# Patient Record
Sex: Female | Born: 1950 | Race: White | Hispanic: No | Marital: Married | Smoking: Never smoker
Health system: Southern US, Community
[De-identification: ages and names within clinical notes are randomized; demographics above are authoritative.]

## PROBLEM LIST (undated history)

## (undated) HISTORY — PX: APPENDECTOMY: SHX54

## (undated) HISTORY — PX: CARPOMETACARPAL (CMC) FUSION OF THUMB: SHX6290

## (undated) HISTORY — PX: WRIST SURGERY: SHX841

## (undated) HISTORY — PX: JOINT REPLACEMENT: SHX530

---

## 2018-05-06 ENCOUNTER — Emergency Department (HOSPITAL_COMMUNITY)
Admission: EM | Admit: 2018-05-06 | Discharge: 2018-05-06 | Disposition: A | Discharge status: Home / Self Care | Payer: BLUE CROSS/BLUE SHIELD | Attending: Emergency Medicine | Admitting: Emergency Medicine

## 2018-05-06 ENCOUNTER — Encounter (HOSPITAL_COMMUNITY): Payer: Self-pay | Admitting: Emergency Medicine

## 2018-05-06 ENCOUNTER — Emergency Department (HOSPITAL_COMMUNITY): Payer: BLUE CROSS/BLUE SHIELD

## 2018-05-06 ENCOUNTER — Other Ambulatory Visit: Payer: Self-pay

## 2018-05-06 DIAGNOSIS — Y999 Unspecified external cause status: Secondary | ICD-10-CM | POA: Diagnosis not present

## 2018-05-06 DIAGNOSIS — Y929 Unspecified place or not applicable: Secondary | ICD-10-CM | POA: Diagnosis not present

## 2018-05-06 DIAGNOSIS — S52532A Colles' fracture of left radius, initial encounter for closed fracture: Secondary | ICD-10-CM | POA: Diagnosis not present

## 2018-05-06 DIAGNOSIS — W010XXA Fall on same level from slipping, tripping and stumbling without subsequent striking against object, initial encounter: Secondary | ICD-10-CM | POA: Diagnosis not present

## 2018-05-06 DIAGNOSIS — S6992XA Unspecified injury of left wrist, hand and finger(s), initial encounter: Secondary | ICD-10-CM | POA: Diagnosis present

## 2018-05-06 DIAGNOSIS — Z23 Encounter for immunization: Secondary | ICD-10-CM | POA: Diagnosis not present

## 2018-05-06 DIAGNOSIS — Y93K1 Activity, walking an animal: Secondary | ICD-10-CM | POA: Insufficient documentation

## 2018-05-06 MED ORDER — TETANUS-DIPHTH-ACELL PERTUSSIS 5-2.5-18.5 LF-MCG/0.5 IM SUSP
0.5000 mL | Freq: Once | INTRAMUSCULAR | Status: AC
Start: 2018-05-06 — End: 2018-05-06
  Administered 2018-05-06: 0.5 mL via INTRAMUSCULAR
  Filled 2018-05-06: qty 0.5

## 2018-05-06 MED ORDER — FENTANYL CITRATE (PF) 100 MCG/2ML IJ SOLN
50.0000 ug | Freq: Once | INTRAMUSCULAR | Status: AC
  Administered 2018-05-06: 50 ug via INTRAVENOUS
  Filled 2018-05-06: qty 2

## 2018-05-06 MED ORDER — LIDOCAINE HCL (PF) 1 % IJ SOLN: 30.0000 mL | Freq: Once | INTRAMUSCULAR | Status: DC

## 2018-05-06 MED ORDER — SODIUM CHLORIDE 0.9 % IV BOLUS
1000.0000 mL | Freq: Once | INTRAVENOUS | Status: AC
  Administered 2018-05-06: 1000 mL via INTRAVENOUS

## 2018-05-06 MED ORDER — OXYCODONE HCL 5 MG PO TABS: 5.0000 mg | ORAL_TABLET | ORAL | 0 refills | Status: DC | PRN

## 2018-05-06 MED ORDER — OXYCODONE-ACETAMINOPHEN 5-325 MG PO TABS
1.0000 | ORAL_TABLET | Freq: Once | ORAL | Status: AC
  Administered 2018-05-06: 1 via ORAL
  Filled 2018-05-06: qty 1

## 2018-05-06 NOTE — ED Provider Notes (Signed)
[facility] EMERGENCY DEPARTMENT Provider Note   CSN: [id number] Arrival date & time: 05/06/18  1920     History   Chief Complaint Chief Complaint  Patient presents with  . Fall    HPI Veronica Santana is a 67 y.o. female.  She is right-hand dominant.  She had a mechanical fall while walking the dog and landing on her outstretched left hand.  She is complaining of severe left wrist pain and some left elbow pain since the fall.  She was diaphoretic and felt like she was going to pass out when she try to get up.  Those symptoms are improved here although she still has a significant amount of pain.  She relates the symptoms of the sweats and the presyncope to the pain and she says this is happened before.  She denies any chest pain or shortness of breath.  She has been ambulatory since the fall.  No LOC no head neck or back pain.  No numbness or tingling anywhere.  The history is provided by the patient.  Fall  This is a new problem. The current episode started 1 to 2 hours ago. The problem occurs constantly. The problem has not changed since onset.Pertinent negatives include no chest pain, no abdominal pain, no headaches and no shortness of breath. The symptoms are aggravated by bending and twisting. The symptoms are relieved by rest. She has tried rest for the symptoms. The treatment provided mild relief.    History reviewed. No pertinent past medical history.  There are no active problems to display for this patient.   History reviewed. No pertinent surgical history.   OB History   None      Home Medications    Prior to Admission medications   Medication Sig Start Date End Date Taking? Authorizing Provider  oxyCODONE (ROXICODONE) 5 MG immediate release tablet Take 1 tablet (5 mg total) by mouth every 4 (four) hours as needed for severe pain. 05/06/18   Hayden Rasmussen, MD    Family History No family history on file.  Social History Social History    Tobacco Use  . Smoking status: Never Smoker  . Smokeless tobacco: Never Used  Substance Use Topics  . Alcohol use: Yes    Comment: occasionally  . Drug use: Never     Allergies   Patient has no allergy information on record.   Review of Systems Review of Systems  Constitutional: Negative for fever.  HENT: Negative for sore throat.   Eyes: Negative for visual disturbance.  Respiratory: Negative for shortness of breath.   Cardiovascular: Negative for chest pain.  Gastrointestinal: Negative for abdominal pain.  Genitourinary: Negative for dysuria.  Musculoskeletal: Negative for back pain and neck pain.  Skin: Positive for wound. Negative for rash.  Neurological: Negative for headaches.     Physical Exam Updated Vital Signs BP (!) 81/54 (BP Location: Right Arm)   Pulse 62   Temp (!) 97.5 F (36.4 C) (Oral)   Resp 18   SpO2 100%   Physical Exam  Constitutional: She appears well-developed and well-nourished. No distress.  HENT:  Head: Normocephalic and atraumatic.  Eyes: Conjunctivae are normal.  Neck: Neck supple.  Cardiovascular: Normal rate and regular rhythm.  No murmur heard. Pulmonary/Chest: Effort normal and breath sounds normal. No respiratory distress.  Abdominal: Soft. There is no tenderness.  Musculoskeletal: She exhibits no edema.  Skin deformity of her left wrist and a small abrasion in that area.  She is  also got some tenderness over the left elbow.  There is normal distal motor and sensation in her digits.  Other extremities full range of motion without any tenderness.  Neurological: She is alert.  Skin: Skin is warm and dry. Capillary refill takes less than 2 seconds.  Psychiatric: She has a normal mood and affect.  Nursing note and vitals reviewed.    ED Treatments / Results  Labs (all labs ordered are listed, but only abnormal results are displayed) Labs Reviewed - No data to display  EKG None  Radiology Dg Elbow Complete  Left  Result Date: 05/06/2018 CLINICAL DATA:  Fall, elbow pain. EXAM: LEFT ELBOW - COMPLETE 3+ VIEW COMPARISON:  None. FINDINGS: There is no evidence of fracture, dislocation, or joint effusion. There is no evidence of arthropathy or other focal bone abnormality. Soft tissues are unremarkable. IMPRESSION: Negative. Electronically Signed   By: Franki Cabot M.D.   On: 05/06/2018 20:38   Dg Forearm Left  Result Date: 05/06/2018 CLINICAL DATA:  Fall, pain. EXAM: LEFT FOREARM - 2 VIEW COMPARISON:  None. FINDINGS: Displaced/comminuted fracture of the distal LEFT radius. Remainder of the LEFT radius is intact and normally aligned. Ulna is intact and normally aligned, except for a slightly displaced fracture of the ulnar styloid process. Distal humerus appears intact and normally positioned. IMPRESSION: 1. Displaced/comminuted fracture of the distal LEFT radius, as described in detail on the dedicated plain film report for the LEFT wrist. 2. Displaced fracture of the ulnar styloid process. 3. Remainder of the LEFT radius and ulna are intact and normally aligned. Electronically Signed   By: Franki Cabot M.D.   On: 05/06/2018 20:41   Dg Wrist Complete Left  Result Date: 05/06/2018 CLINICAL DATA:  Fall, wrist pain. EXAM: LEFT WRIST - COMPLETE 3+ VIEW COMPARISON:  None. FINDINGS: Displaced/comminuted fracture of the distal LEFT radius, with involvement of the articular surface at the radiocarpal joint. There is neutral alignment at the radiocarpal joint. Additional displaced fracture of the ulnar styloid. Distal ulna otherwise intact. Carpal bones appear intact and normally aligned. IMPRESSION: 1. Displaced/comminuted fracture of the distal LEFT radius, with extension to the articular surface, and loss of the normal volar alignment at the radiocarpal joint space (now neutral alignment). 2. Displaced fracture of the ulnar styloid process. 3. Carpal bones appear intact and normally aligned. Electronically Signed   By:  Franki Cabot M.D.   On: 05/06/2018 20:40    Procedures .Splint Application Date/Time: 5/85/2778 10:33 AM Performed by: Hayden Rasmussen, MD Authorized by: Hayden Rasmussen, MD   Consent:    Consent obtained:  Verbal   Consent given by:  Patient   Risks discussed:  Numbness, pain, swelling and discoloration   Alternatives discussed:  No treatment and delayed treatment Pre-procedure details:    Sensation:  Normal   Skin color:  Pink Procedure details:    Laterality:  Left   Location:  Wrist   Wrist:  L wrist   Cast type:  Short arm   Splint type:  Sugar tong   Supplies:  Ortho-Glass Post-procedure details:    Pain:  Improved   Sensation:  Normal   Skin color:  Pink   Patient tolerance of procedure:  Tolerated well, no immediate complications   (including critical care time)  Medications Ordered in ED Medications  fentaNYL (SUBLIMAZE) injection 50 mcg (has no administration in time range)  sodium chloride 0.9 % bolus 1,000 mL (has no administration in time range)  Initial Impression / Assessment and Plan / ED Course  I have reviewed the triage vital signs and the nursing notes.  Pertinent labs & imaging results that were available during my care of the patient were reviewed by me and considered in my medical decision making (see chart for details).  Clinical Course as of May 08 1032  Sat May 06, 2018  2059 Updated patient on the results of her x-rays.  She states the pain medicine helps a little bit but is back to being severe.  We will order another dose of pain medicine and I have put a consult into hand surgery.   [MB]  2107 Discussed with Dr. Lenon Curt who recommends splinting the patient and have her follow-up on Monday because she will likely need operative repair.   [MB]    Clinical Course User Index [MB] Hayden Rasmussen, MD     Final Clinical Impressions(s) / ED Diagnoses   Final diagnoses:  Colles' fracture of left radius, initial encounter for  closed fracture    ED Discharge Orders        Ordered    oxyCODONE (ROXICODONE) 5 MG immediate release tablet  Every 4 hours PRN     05/06/18 2231       Hayden Rasmussen, MD 05/08/18 1034

## 2018-05-06 NOTE — Discharge Instructions (Addendum)
You were evaluated in the emergency department for a fall in which she broke your left distal radius and ulna.  You been placed in a splint but ultimately you will need to follow-up with hand surgeon for an operative repair.  You should keep the area elevated and put ice on top of the splint to help with the pain and swelling.  Tylenol for pain and oxycodone for more severe pain.  Please continue to wiggle your fingers to keep the circulation going.  You should keep the splint on clean and dry.  Please call Dr. Lenon Curt on Monday for follow-up appointment.  Return if any problems.

## 2018-05-06 NOTE — ED Triage Notes (Signed)
Pt from home. States she was out walking her dog and tripped and fell landing on her left wrist and elbow.  Husband states that after he got to her she was syncopal and diaphoretic.  Pt is hypotensive and diaphoretic in triage.

## 2018-09-26 DIAGNOSIS — S52562D Barton's fracture of left radius, subsequent encounter for closed fracture with routine healing: Secondary | ICD-10-CM | POA: Diagnosis not present

## 2018-10-02 DIAGNOSIS — D225 Melanocytic nevi of trunk: Secondary | ICD-10-CM | POA: Diagnosis not present

## 2018-10-02 DIAGNOSIS — L905 Scar conditions and fibrosis of skin: Secondary | ICD-10-CM | POA: Diagnosis not present

## 2018-10-02 DIAGNOSIS — Z1283 Encounter for screening for malignant neoplasm of skin: Secondary | ICD-10-CM | POA: Diagnosis not present

## 2018-10-17 DIAGNOSIS — H2513 Age-related nuclear cataract, bilateral: Secondary | ICD-10-CM | POA: Diagnosis not present

## 2018-11-01 DIAGNOSIS — Z124 Encounter for screening for malignant neoplasm of cervix: Secondary | ICD-10-CM | POA: Diagnosis not present

## 2018-11-01 DIAGNOSIS — Z1231 Encounter for screening mammogram for malignant neoplasm of breast: Secondary | ICD-10-CM | POA: Diagnosis not present

## 2018-11-16 ENCOUNTER — Other Ambulatory Visit: Payer: Self-pay | Admitting: Obstetrics & Gynecology

## 2018-11-16 DIAGNOSIS — M8580 Other specified disorders of bone density and structure, unspecified site: Secondary | ICD-10-CM

## 2019-01-07 ENCOUNTER — Ambulatory Visit (HOSPITAL_COMMUNITY)
Admission: EM | Admit: 2019-01-07 | Discharge: 2019-01-07 | Disposition: A | Discharge status: Home / Self Care | Payer: Medicare Other | Attending: Family Medicine | Admitting: Family Medicine

## 2019-01-07 ENCOUNTER — Other Ambulatory Visit: Payer: Self-pay

## 2019-01-07 ENCOUNTER — Encounter (HOSPITAL_COMMUNITY): Payer: Self-pay

## 2019-01-07 DIAGNOSIS — H01004 Unspecified blepharitis left upper eyelid: Secondary | ICD-10-CM

## 2019-01-07 DIAGNOSIS — H00024 Hordeolum internum left upper eyelid: Secondary | ICD-10-CM | POA: Diagnosis not present

## 2019-01-07 MED ORDER — ERYTHROMYCIN 5 MG/GM OP OINT: TOPICAL_OINTMENT | OPHTHALMIC | 0 refills | Status: DC

## 2019-01-07 NOTE — ED Provider Notes (Signed)
[facility]    CSN: [id number] Arrival date & time: 01/07/19  1415     History   Chief Complaint Chief Complaint  Patient presents with  . eyelid pain    HPI Veronica Santana is a 68 y.o. female.   68 year old female comes in for 2 day history of left upper eyelid swelling/irritation. States noticed worsening swelling and came in for evaluation. Denies vision changes, photophobia, eye redness. Denies injury/trauma. Denies new hygiene product changes/makeup. Denies painful eye movement. Warm compress without relief. Wears glasses, denies contact lens use.      History reviewed. No pertinent past medical history.  There are no active problems to display for this patient.   History reviewed. No pertinent surgical history.  OB History   No obstetric history on file.      Home Medications    Prior to Admission medications   Medication Sig Start Date End Date Taking? Authorizing Provider  DULoxetine (CYMBALTA) 30 MG capsule Take by mouth. 01/24/18  Yes [provider]  erythromycin ophthalmic ointment Place a 1/2 inch ribbon of ointment to affected area 4 times a day for the next 7 days. 01/07/19   Tasia Catchings, Shivaan Tierno V, PA-C  oxyCODONE (ROXICODONE) 5 MG immediate release tablet Take 1 tablet (5 mg total) by mouth every 4 (four) hours as needed for severe pain. 05/06/18   Hayden Rasmussen, MD    Family History History reviewed. No pertinent family history.  Social History Social History   Tobacco Use  . Smoking status: Never Smoker  . Smokeless tobacco: Never Used  Substance Use Topics  . Alcohol use: Yes    Comment: occasionally  . Drug use: Never     Allergies   Patient has no known allergies.   Review of Systems Review of Systems  Reason unable to perform ROS: See HPI as above.     Physical Exam Triage Vital Signs ED Triage Vitals  Enc Vitals Group     BP 01/07/19 1538 131/68     Pulse Rate 01/07/19 1538 84     Resp 01/07/19 1538 18   Temp 01/07/19 1538 98.6 F (37 C)     Temp Source 01/07/19 1538 Oral     SpO2 01/07/19 1538 100 %     Weight 01/07/19 1540 175 lb (79.4 kg)     Height --      Head Circumference --      Peak Flow --      Pain Score 01/07/19 1539 2     Pain Loc --      Pain Edu? --      Excl. in [place]? --    No data found.  Updated Vital Signs BP 131/68 (BP Location: Right Arm)   Pulse 84   Temp 98.6 F (37 C) (Oral)   Resp 18   Wt 175 lb (79.4 kg)   SpO2 100%   Physical Exam Constitutional:      General: She is not in acute distress.    Appearance: She is well-developed. She is not ill-appearing, toxic-appearing or diaphoretic.  HENT:     Head: Normocephalic and atraumatic.     Nose: Nose normal. No congestion or rhinorrhea.  Eyes:     Extraocular Movements: Extraocular movements intact.     Conjunctiva/sclera: Conjunctivae normal.     Pupils: Pupils are equal, round, and reactive to light.     Comments: Mild generalized swelling to the left upper eyelid. Mild erythema to the  nasal aspect of the upper eyelid. Small hordeolum to the nasal aspect. Tenderness to palpation.   Neurological:     Mental Status: She is alert and oriented to person, place, and time.      UC Treatments / Results  Labs (all labs ordered are listed, but only abnormal results are displayed) Labs Reviewed - No data to display  EKG None  Radiology No results found.  Procedures Procedures (including critical care time)  Medications Ordered in UC Medications - No data to display  Initial Impression / Assessment and Plan / UC Course  I have reviewed the triage vital signs and the nursing notes.  Pertinent labs & imaging results that were available during my care of the patient were reviewed by me and considered in my medical decision making (see chart for details).    Erythromycin ointment as directed. Lid scrubs and warm compresses as directed. Patient to follow up with ophthalmology if symptoms worsens  or does not improve. Return precautions given.    Final Clinical Impressions(s) / UC Diagnoses   Final diagnoses:  Hordeolum internum of left upper eyelid  Blepharitis of left upper eyelid, unspecified type    ED Prescriptions    Medication Sig Dispense Auth. Provider   erythromycin ophthalmic ointment Place a 1/2 inch ribbon of ointment to affected area 4 times a day for the next 7 days. 1 g Tobin Chad, [place] 01/07/19 [phone]

## 2019-01-07 NOTE — ED Triage Notes (Signed)
Pt cc left eyelid pain this started yesterday.

## 2019-01-07 NOTE — Discharge Instructions (Signed)
Start erythromycin ointment as directed. Lid scrubs and warm compresses as directed. Monitor for any worsening of symptoms, changes in vision, sensitivity to light, eye swelling, painful eye movement, follow up with ophthalmology for further evaluation.

## 2019-01-20 DIAGNOSIS — Z23 Encounter for immunization: Secondary | ICD-10-CM | POA: Diagnosis not present

## 2019-05-17 ENCOUNTER — Other Ambulatory Visit: Payer: Self-pay

## 2019-05-17 ENCOUNTER — Ambulatory Visit
Admission: RE | Admit: 2019-05-17 | Discharge: 2019-05-17 | Disposition: A | Discharge status: Home / Self Care | Payer: Medicare Other | Source: Ambulatory Visit | Attending: Obstetrics & Gynecology | Admitting: Obstetrics & Gynecology

## 2019-05-17 DIAGNOSIS — M85852 Other specified disorders of bone density and structure, left thigh: Secondary | ICD-10-CM | POA: Diagnosis not present

## 2019-05-17 DIAGNOSIS — M8580 Other specified disorders of bone density and structure, unspecified site: Secondary | ICD-10-CM

## 2019-05-29 DIAGNOSIS — H35373 Puckering of macula, bilateral: Secondary | ICD-10-CM | POA: Diagnosis not present

## 2019-07-25 DIAGNOSIS — E663 Overweight: Secondary | ICD-10-CM | POA: Diagnosis not present

## 2019-07-25 DIAGNOSIS — M85832 Other specified disorders of bone density and structure, left forearm: Secondary | ICD-10-CM | POA: Diagnosis not present

## 2019-07-25 DIAGNOSIS — Z1211 Encounter for screening for malignant neoplasm of colon: Secondary | ICD-10-CM | POA: Diagnosis not present

## 2019-07-25 DIAGNOSIS — Z6834 Body mass index (BMI) 34.0-34.9, adult: Secondary | ICD-10-CM | POA: Diagnosis not present

## 2019-07-25 DIAGNOSIS — F418 Other specified anxiety disorders: Secondary | ICD-10-CM | POA: Diagnosis not present

## 2019-08-21 DIAGNOSIS — Z23 Encounter for immunization: Secondary | ICD-10-CM | POA: Diagnosis not present

## 2019-10-22 DIAGNOSIS — H2513 Age-related nuclear cataract, bilateral: Secondary | ICD-10-CM | POA: Diagnosis not present

## 2019-10-24 DIAGNOSIS — Z1159 Encounter for screening for other viral diseases: Secondary | ICD-10-CM | POA: Diagnosis not present

## 2019-10-29 DIAGNOSIS — D120 Benign neoplasm of cecum: Secondary | ICD-10-CM | POA: Diagnosis not present

## 2019-10-29 DIAGNOSIS — D122 Benign neoplasm of ascending colon: Secondary | ICD-10-CM | POA: Diagnosis not present

## 2019-10-29 DIAGNOSIS — K621 Rectal polyp: Secondary | ICD-10-CM | POA: Diagnosis not present

## 2019-10-29 DIAGNOSIS — Z1211 Encounter for screening for malignant neoplasm of colon: Secondary | ICD-10-CM | POA: Diagnosis not present

## 2019-10-31 DIAGNOSIS — D122 Benign neoplasm of ascending colon: Secondary | ICD-10-CM | POA: Diagnosis not present

## 2019-10-31 DIAGNOSIS — D120 Benign neoplasm of cecum: Secondary | ICD-10-CM | POA: Diagnosis not present

## 2019-10-31 DIAGNOSIS — K621 Rectal polyp: Secondary | ICD-10-CM | POA: Diagnosis not present

## 2019-11-17 IMAGING — DX DG ELBOW COMPLETE 3+V*L*
4 series · 4 of 4 positions shown · non-contrast
Comparison: None.

CLINICAL DATA: Fall, elbow pain.

EXAM:
LEFT ELBOW - COMPLETE 3+ VIEW

[elbow ap]
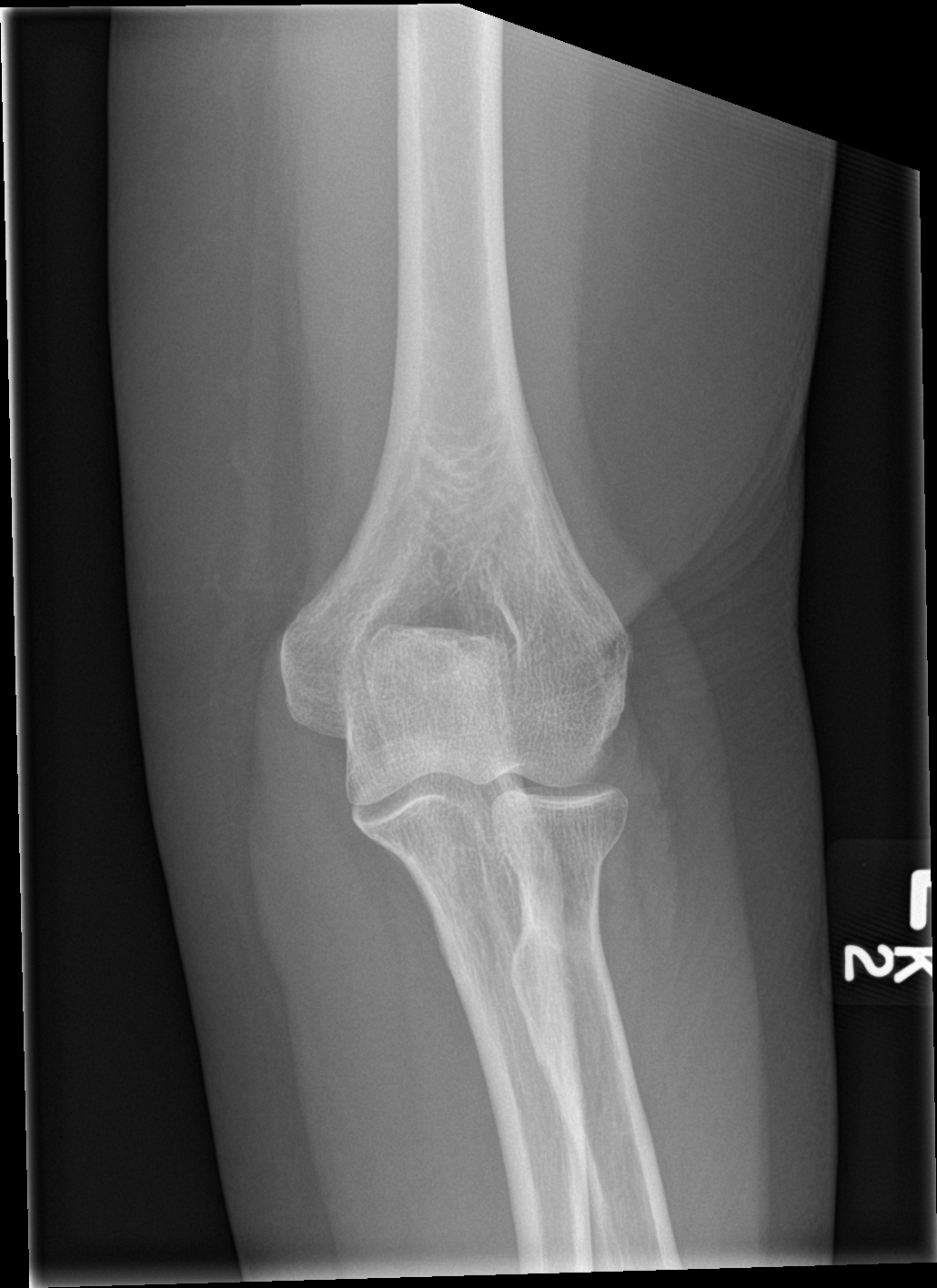

[elbow obl (1 of 2)]
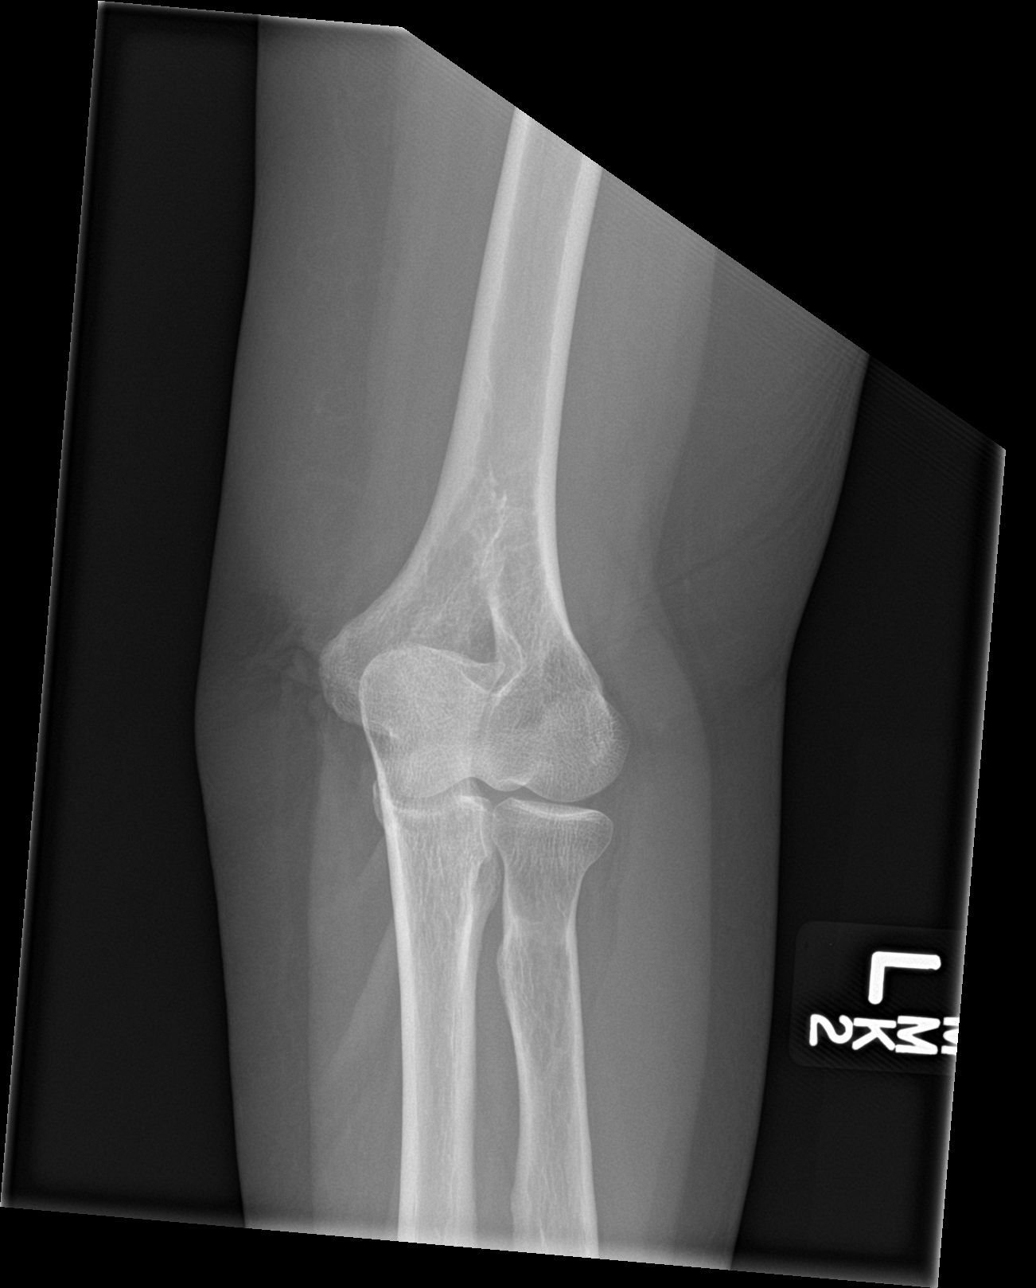

[elbow obl (2 of 2)]
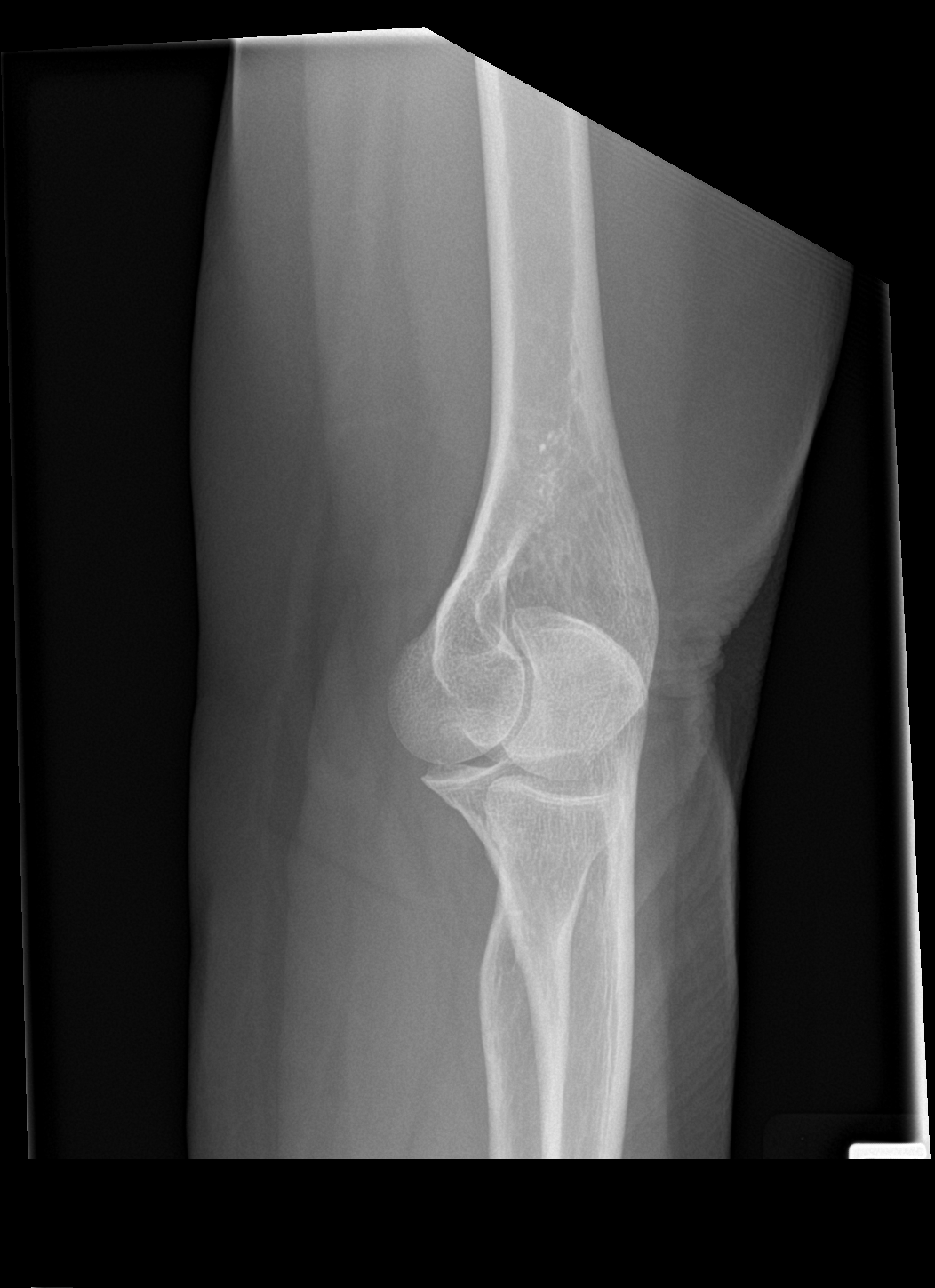

[elbow lat]
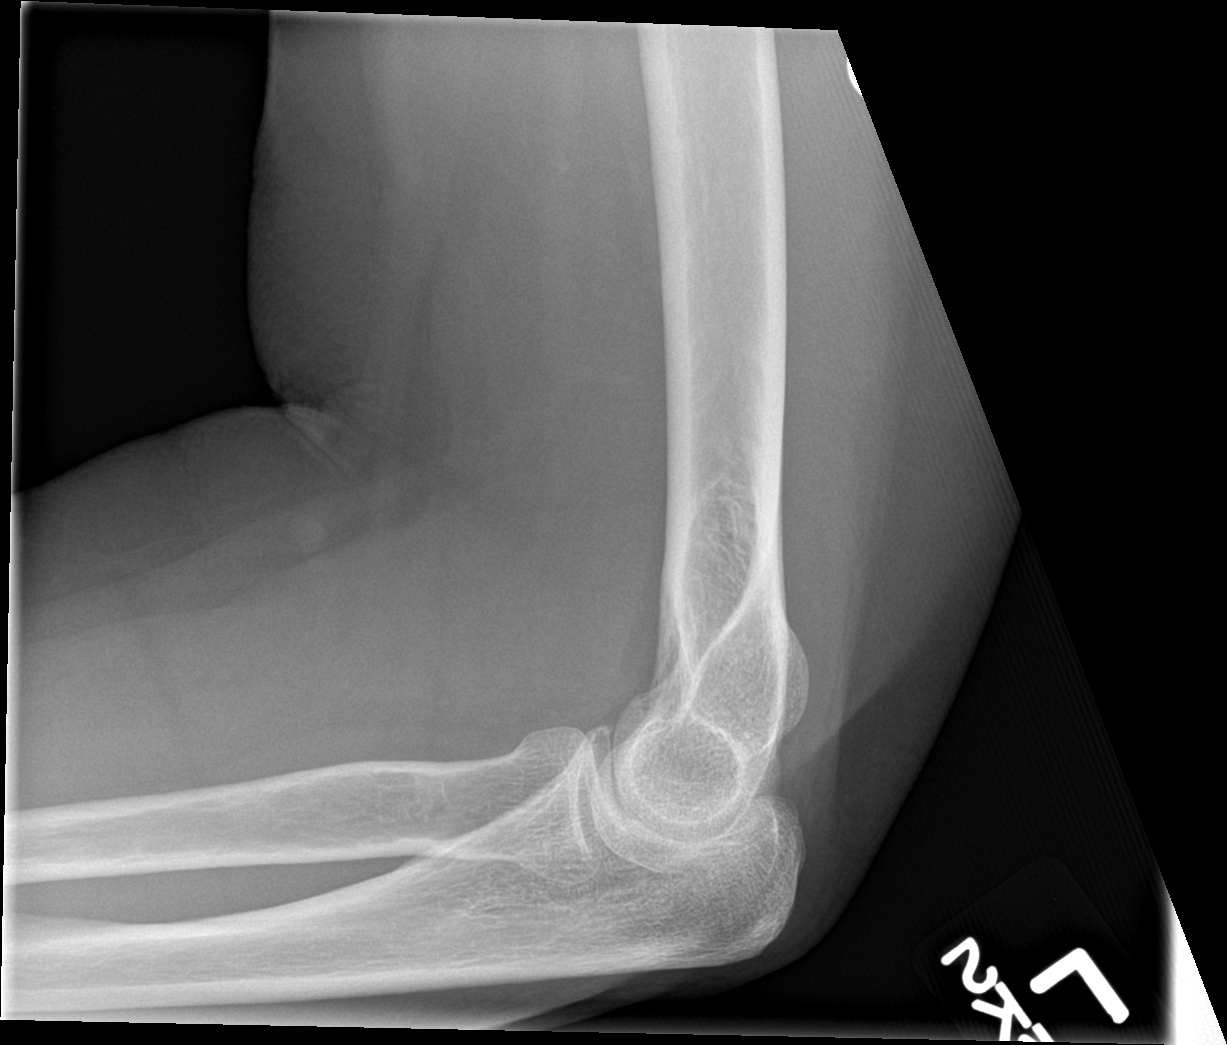

[4 of 4 positions shown; findings below may reference images not displayed]

FINDINGS: There is no evidence of fracture, dislocation, or joint effusion.
There is no evidence of arthropathy or other focal bone abnormality.
Soft tissues are unremarkable.
IMPRESSION: Negative.

## 2019-12-09 ENCOUNTER — Ambulatory Visit: Payer: Medicare Other | Attending: Internal Medicine

## 2019-12-09 DIAGNOSIS — Z23 Encounter for immunization: Secondary | ICD-10-CM | POA: Diagnosis not present

## 2019-12-09 NOTE — Progress Notes (Signed)
   Covid-19 Vaccination Clinic  Name:  Veronica Santana    MRN: [id number] DOB: [birth year]  12/09/2019  [name] was observed post Covid-19 immunization for 15 minutes without incidence. She was provided with Vaccine Information Sheet and instruction to access the V-Safe system.   [name] was instructed to call 911 with any severe reactions post vaccine: Marland Kitchen Difficulty breathing  . Swelling of your face and throat  . A fast heartbeat  . A bad rash all over your body  . Dizziness and weakness

## 2019-12-10 DIAGNOSIS — Z1231 Encounter for screening mammogram for malignant neoplasm of breast: Secondary | ICD-10-CM | POA: Diagnosis not present

## 2019-12-27 ENCOUNTER — Ambulatory Visit: Payer: Medicare Other | Attending: Internal Medicine

## 2019-12-27 DIAGNOSIS — Z23 Encounter for immunization: Secondary | ICD-10-CM | POA: Insufficient documentation

## 2019-12-27 NOTE — Progress Notes (Signed)
   Covid-19 Vaccination Clinic  Name:  Veronica Santana    MRN: [id number] DOB: 1951-02-18  12/27/2019  [name] was observed post Covid-19 immunization for 15 minutes without incidence. She was provided with Vaccine Information Sheet and instruction to access the V-Safe system.   [name] was instructed to call 911 with any severe reactions post vaccine: Marland Kitchen Difficulty breathing  . Swelling of your face and throat  . A fast heartbeat  . A bad rash all over your body  . Dizziness and weakness    Immunizations Administered    Name Date Dose VIS Date Route   Pfizer COVID-19 Vaccine 12/27/2019  3:16 PM 0.3 mL 11/02/2019 Intramuscular   Manufacturer: [place]   Lot: [id number]   [place]: [id number]

## 2019-12-31 ENCOUNTER — Ambulatory Visit: Payer: Medicare Other

## 2020-01-15 DIAGNOSIS — F418 Other specified anxiety disorders: Secondary | ICD-10-CM | POA: Diagnosis not present

## 2020-01-15 DIAGNOSIS — N952 Postmenopausal atrophic vaginitis: Secondary | ICD-10-CM | POA: Diagnosis not present

## 2020-01-15 DIAGNOSIS — Z01419 Encounter for gynecological examination (general) (routine) without abnormal findings: Secondary | ICD-10-CM | POA: Diagnosis not present

## 2020-01-15 DIAGNOSIS — Z124 Encounter for screening for malignant neoplasm of cervix: Secondary | ICD-10-CM | POA: Diagnosis not present

## 2020-03-03 DIAGNOSIS — D225 Melanocytic nevi of trunk: Secondary | ICD-10-CM | POA: Diagnosis not present

## 2020-03-03 DIAGNOSIS — Z1283 Encounter for screening for malignant neoplasm of skin: Secondary | ICD-10-CM | POA: Diagnosis not present

## 2020-03-25 DIAGNOSIS — E559 Vitamin D deficiency, unspecified: Secondary | ICD-10-CM | POA: Diagnosis not present

## 2020-03-25 DIAGNOSIS — J302 Other seasonal allergic rhinitis: Secondary | ICD-10-CM | POA: Diagnosis not present

## 2020-03-25 DIAGNOSIS — Z1389 Encounter for screening for other disorder: Secondary | ICD-10-CM | POA: Diagnosis not present

## 2020-03-25 DIAGNOSIS — Z0000 Encounter for general adult medical examination without abnormal findings: Secondary | ICD-10-CM | POA: Diagnosis not present

## 2020-03-25 DIAGNOSIS — E663 Overweight: Secondary | ICD-10-CM | POA: Diagnosis not present

## 2020-03-25 DIAGNOSIS — F418 Other specified anxiety disorders: Secondary | ICD-10-CM | POA: Diagnosis not present

## 2020-04-25 ENCOUNTER — Ambulatory Visit (HOSPITAL_COMMUNITY)
Admission: EM | Admit: 2020-04-25 | Discharge: 2020-04-25 | Disposition: A | Discharge status: Home / Self Care | Payer: Medicare Other

## 2020-04-25 ENCOUNTER — Other Ambulatory Visit: Payer: Self-pay

## 2020-04-25 ENCOUNTER — Encounter (HOSPITAL_COMMUNITY): Payer: Self-pay

## 2020-04-25 DIAGNOSIS — H6123 Impacted cerumen, bilateral: Secondary | ICD-10-CM | POA: Diagnosis not present

## 2020-04-25 DIAGNOSIS — H9201 Otalgia, right ear: Secondary | ICD-10-CM

## 2020-04-25 NOTE — ED Triage Notes (Signed)
Patient complaining of right ear pressure, muffled hearing, intermittent pain. States it's been going on for 10 days but has worsened over the last 3-4. States she does have seasonal allergies and takes allegra daily. Hx impacted cerumen.

## 2020-04-25 NOTE — Discharge Instructions (Signed)
Please pick up Debrox otc to continue working on the left ear.

## 2020-04-25 NOTE — ED Provider Notes (Signed)
[facility]   MRN: [id number] DOB: 1951-10-22  Subjective:   Dorlene Footman is a 69 y.o. female presenting for 1 to 2-week history of persistent right-sided decreased hearing, now having pain. Has also had intermittent symptoms over the left ear. Has a remote history of cerumen impaction needing an ear lavage. Denies fever, ear drainage, sore throat, sinus pain. Patient does not use Q-tips for her ears. She generally just uses Kleenex in her pinky finger.  No current facility-administered medications for this encounter.  Current Outpatient Medications:  .  DULoxetine (CYMBALTA) 30 MG capsule, Take by mouth., Disp: , Rfl:  .  erythromycin ophthalmic ointment, Place a 1/2 inch ribbon of ointment to affected area 4 times a day for the next 7 days., Disp: 1 g, Rfl: 0 .  oxyCODONE (ROXICODONE) 5 MG immediate release tablet, Take 1 tablet (5 mg total) by mouth every 4 (four) hours as needed for severe pain., Disp: 15 tablet, Rfl: 0   No Known Allergies  History reviewed. No pertinent past medical history.   Past Surgical History:  Procedure Laterality Date  . APPENDECTOMY    . JOINT REPLACEMENT      No family history on file.  Social History   Tobacco Use  . Smoking status: Never Smoker  . Smokeless tobacco: Never Used  Substance Use Topics  . Alcohol use: Yes    Comment: occasionally  . Drug use: Never    ROS   Objective:   Vitals: BP 139/88 (BP Location: Right Arm)   Pulse 87   Temp 98.2 F (36.8 C) (Oral)   Resp 12   SpO2 100%   Physical Exam Constitutional:      General: She is not in acute distress.    Appearance: Normal appearance. She is well-developed. She is not ill-appearing, toxic-appearing or diaphoretic.  HENT:     Head: Normocephalic and atraumatic.     Right Ear: External ear normal. There is impacted cerumen.     Left Ear: External ear normal. There is impacted cerumen.     Nose: Nose normal.     Mouth/Throat:     Mouth: Mucous  membranes are moist.     Pharynx: Oropharynx is clear.  Eyes:     General: No scleral icterus.    Extraocular Movements: Extraocular movements intact.     Pupils: Pupils are equal, round, and reactive to light.  Cardiovascular:     Rate and Rhythm: Normal rate.  Pulmonary:     Effort: Pulmonary effort is normal.  Skin:    General: Skin is warm and dry.  Neurological:     General: No focal deficit present.     Mental Status: She is alert and oriented to person, place, and time.  Psychiatric:        Mood and Affect: Mood normal.        Behavior: Behavior normal.     Ear lavage performed using mixture of peroxide and water.  Pressure irrigation performed using a bottle and a thin ear tube.  Bilateral ear lavage.     Assessment and Plan :   PDMP not reviewed this encounter.  1. Otalgia of right ear   2. Bilateral impacted cerumen     Successful bilateral ear lavage.  General management of cerumen impaction reviewed with patient.  Anticipatory guidance provided. Counseled patient on potential for adverse effects with medications prescribed/recommended today, ER and return-to-clinic precautions discussed, patient verbalized understanding.    Jaynee Eagles, PA-C 04/25/20 1447

## 2020-04-25 NOTE — ED Notes (Signed)
Irrigated ears with warm water and H2O2. Large solid piece of cerumen evacuated from right ear and small amount evacuated from right ear. Pt tolerated well.

## 2020-06-11 DIAGNOSIS — Z23 Encounter for immunization: Secondary | ICD-10-CM | POA: Diagnosis not present

## 2020-07-24 DIAGNOSIS — Z23 Encounter for immunization: Secondary | ICD-10-CM | POA: Diagnosis not present

## 2020-08-18 DIAGNOSIS — Z23 Encounter for immunization: Secondary | ICD-10-CM | POA: Diagnosis not present

## 2020-09-09 DIAGNOSIS — L720 Epidermal cyst: Secondary | ICD-10-CM | POA: Diagnosis not present

## 2020-10-24 DIAGNOSIS — H40013 Open angle with borderline findings, low risk, bilateral: Secondary | ICD-10-CM | POA: Diagnosis not present

## 2021-01-22 DIAGNOSIS — Z124 Encounter for screening for malignant neoplasm of cervix: Secondary | ICD-10-CM | POA: Diagnosis not present

## 2021-01-22 DIAGNOSIS — M8580 Other specified disorders of bone density and structure, unspecified site: Secondary | ICD-10-CM | POA: Diagnosis not present

## 2021-01-22 DIAGNOSIS — Z6829 Body mass index (BMI) 29.0-29.9, adult: Secondary | ICD-10-CM | POA: Diagnosis not present

## 2021-01-22 DIAGNOSIS — N952 Postmenopausal atrophic vaginitis: Secondary | ICD-10-CM | POA: Diagnosis not present

## 2021-01-22 DIAGNOSIS — Z1231 Encounter for screening mammogram for malignant neoplasm of breast: Secondary | ICD-10-CM | POA: Diagnosis not present

## 2021-01-22 DIAGNOSIS — Z01419 Encounter for gynecological examination (general) (routine) without abnormal findings: Secondary | ICD-10-CM | POA: Diagnosis not present

## 2021-01-22 DIAGNOSIS — F418 Other specified anxiety disorders: Secondary | ICD-10-CM | POA: Diagnosis not present

## 2021-01-22 DIAGNOSIS — Z01411 Encounter for gynecological examination (general) (routine) with abnormal findings: Secondary | ICD-10-CM | POA: Diagnosis not present

## 2021-02-23 DIAGNOSIS — Z23 Encounter for immunization: Secondary | ICD-10-CM | POA: Diagnosis not present

## 2021-03-30 DIAGNOSIS — G629 Polyneuropathy, unspecified: Secondary | ICD-10-CM | POA: Diagnosis not present

## 2021-03-30 DIAGNOSIS — F329 Major depressive disorder, single episode, unspecified: Secondary | ICD-10-CM | POA: Diagnosis not present

## 2021-03-30 DIAGNOSIS — Z6828 Body mass index (BMI) 28.0-28.9, adult: Secondary | ICD-10-CM | POA: Diagnosis not present

## 2021-03-30 DIAGNOSIS — Z0000 Encounter for general adult medical examination without abnormal findings: Secondary | ICD-10-CM | POA: Diagnosis not present

## 2021-03-30 DIAGNOSIS — R5383 Other fatigue: Secondary | ICD-10-CM | POA: Diagnosis not present

## 2021-03-30 DIAGNOSIS — R799 Abnormal finding of blood chemistry, unspecified: Secondary | ICD-10-CM | POA: Diagnosis not present

## 2021-03-30 DIAGNOSIS — L659 Nonscarring hair loss, unspecified: Secondary | ICD-10-CM | POA: Diagnosis not present

## 2021-05-20 DIAGNOSIS — H40013 Open angle with borderline findings, low risk, bilateral: Secondary | ICD-10-CM | POA: Diagnosis not present

## 2021-07-29 DIAGNOSIS — Z23 Encounter for immunization: Secondary | ICD-10-CM | POA: Diagnosis not present

## 2021-09-09 DIAGNOSIS — Z20822 Contact with and (suspected) exposure to covid-19: Secondary | ICD-10-CM | POA: Diagnosis not present

## 2021-09-15 DIAGNOSIS — Z1283 Encounter for screening for malignant neoplasm of skin: Secondary | ICD-10-CM | POA: Diagnosis not present

## 2021-09-15 DIAGNOSIS — L820 Inflamed seborrheic keratosis: Secondary | ICD-10-CM | POA: Diagnosis not present

## 2021-09-15 DIAGNOSIS — D225 Melanocytic nevi of trunk: Secondary | ICD-10-CM | POA: Diagnosis not present

## 2021-10-28 DIAGNOSIS — H2513 Age-related nuclear cataract, bilateral: Secondary | ICD-10-CM | POA: Diagnosis not present

## 2021-10-30 DIAGNOSIS — M1811 Unilateral primary osteoarthritis of first carpometacarpal joint, right hand: Secondary | ICD-10-CM | POA: Diagnosis not present

## 2021-10-30 DIAGNOSIS — M79644 Pain in right finger(s): Secondary | ICD-10-CM | POA: Diagnosis not present

## 2021-11-09 DIAGNOSIS — M79644 Pain in right finger(s): Secondary | ICD-10-CM | POA: Diagnosis not present

## 2021-11-09 DIAGNOSIS — R202 Paresthesia of skin: Secondary | ICD-10-CM | POA: Diagnosis not present

## 2021-11-09 DIAGNOSIS — M1811 Unilateral primary osteoarthritis of first carpometacarpal joint, right hand: Secondary | ICD-10-CM | POA: Diagnosis not present

## 2021-11-09 DIAGNOSIS — M19041 Primary osteoarthritis, right hand: Secondary | ICD-10-CM | POA: Diagnosis not present

## 2021-11-09 DIAGNOSIS — G8929 Other chronic pain: Secondary | ICD-10-CM | POA: Diagnosis not present

## 2021-11-09 DIAGNOSIS — R200 Anesthesia of skin: Secondary | ICD-10-CM | POA: Diagnosis not present

## 2022-02-22 ENCOUNTER — Other Ambulatory Visit: Payer: Self-pay | Admitting: Obstetrics & Gynecology

## 2022-02-22 DIAGNOSIS — M8580 Other specified disorders of bone density and structure, unspecified site: Secondary | ICD-10-CM

## 2022-08-11 ENCOUNTER — Other Ambulatory Visit: Payer: Medicare Other

## 2022-11-08 ENCOUNTER — Ambulatory Visit
Admission: RE | Admit: 2022-11-08 | Discharge: 2022-11-08 | Disposition: A | Discharge status: Home / Self Care | Payer: Medicare Other | Source: Ambulatory Visit | Attending: Obstetrics & Gynecology | Admitting: Obstetrics & Gynecology

## 2022-11-08 DIAGNOSIS — M8580 Other specified disorders of bone density and structure, unspecified site: Secondary | ICD-10-CM

## 2023-01-01 ENCOUNTER — Encounter (HOSPITAL_COMMUNITY): Payer: Self-pay | Admitting: Emergency Medicine

## 2023-01-01 ENCOUNTER — Ambulatory Visit (HOSPITAL_COMMUNITY)
Admission: EM | Admit: 2023-01-01 | Discharge: 2023-01-01 | Disposition: A | Discharge status: Home / Self Care | Payer: Medicare Other

## 2023-01-01 ENCOUNTER — Other Ambulatory Visit: Payer: Self-pay

## 2023-01-01 DIAGNOSIS — R220 Localized swelling, mass and lump, head: Secondary | ICD-10-CM

## 2023-01-01 MED ORDER — AMOXICILLIN-POT CLAVULANATE 875-125 MG PO TABS: 1.0000 | ORAL_TABLET | Freq: Two times a day (BID) | ORAL | 0 refills | Status: AC

## 2023-01-01 NOTE — Discharge Instructions (Addendum)
Please take medication as prescribed. Take with food to avoid upset stomach.  Continue sour candies. Try warm compress, tylenol/ibuprofen, and lots of fluids  Please monitor symptoms and return if no improvement. Please go to the emergency department if symptoms worsen.

## 2023-01-01 NOTE — ED Triage Notes (Signed)
History of salivary gland infections.    It has been on left side.  Patient uses sour lemon drops, and it goes away.  This episode, it is not helping.    This episode started yesterday.    Pain in left side of face, pain with chewing.  And patient has noticed swelling .

## 2023-01-01 NOTE — ED Provider Notes (Signed)
[facility]    CSN: [id number] Arrival date & time: 01/01/23  1003     History   Chief Complaint Chief Complaint  Patient presents with   facial pain    HPI Veronica Santana is a 72 y.o. female.  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 No pertinent past medical history.  There are no problems to display for this patient.   Past Surgical History:  Procedure Laterality Date   APPENDECTOMY     CARPOMETACARPAL ([place]) FUSION OF THUMB     JOINT REPLACEMENT     WRIST SURGERY      OB History   No obstetric history on file.      Home Medications    Prior to Admission medications   Medication Sig Start Date End Date Taking? Authorizing Provider  amoxicillin-clavulanate (AUGMENTIN) 875-125 MG tablet Take 1 tablet by mouth 2 (two) times daily for 7 days. 01/01/23 01/08/23 Yes Kristeen Lantz, Wells Guiles, PA-C  amLODipine (NORVASC) 2.5 MG tablet     [provider]  DULoxetine (CYMBALTA) 30 MG capsule Take by mouth. 01/24/18   [provider]  lisinopril (ZESTRIL) 5 MG tablet Take 5 mg by mouth daily.    [provider]    Family History Family History  Problem Relation Age of Onset   Hypertension Mother    Hypertension Father     Social History Social History   Tobacco Use   Smoking status: Never   Smokeless tobacco: Never  Vaping Use   Vaping Use: Never used  Substance Use Topics   Alcohol use: Yes    Comment: occasionally   Drug use: Never     Allergies   Patient has no known allergies.   Review of Systems Review of Systems As per HPI  Physical Exam Triage Vital Signs ED Triage Vitals [01/01/23 1035]  Enc Vitals Group     BP      Pulse      Resp      Temp       Temp src      SpO2      Weight      Height      Head Circumference      Peak Flow      Pain Score 5     Pain Loc      Pain Edu?      Excl. in [place]?    No data found.  Updated Vital Signs BP 118/75 (BP Location: Right Arm)   Pulse 68   Temp 98.4 F (36.9 C) (Oral)   Resp 18   SpO2 98%   Physical Exam Vitals and nursing note reviewed.  Constitutional:      General: She is not in acute distress.    Comments: Speaks in full sentences. Airway patent   HENT:     Head:      Comments: Left side facial swelling. Non tender, non fluctuant    Mouth/Throat:     Mouth: Mucous membranes are moist.     Dentition: No dental tenderness, gingival swelling or dental abscesses.     Pharynx: Oropharynx is clear. No pharyngeal swelling.     Comments: No dental tenderness. No swelling in the mouth Cardiovascular:  Rate and Rhythm: Normal rate and regular rhythm.     Pulses: Normal pulses.     Heart sounds: Normal heart sounds.  Pulmonary:     Effort: Pulmonary effort is normal.     Breath sounds: Normal breath sounds.  Musculoskeletal:     Cervical back: Normal range of motion.  Lymphadenopathy:     Cervical: No cervical adenopathy.  Skin:    General: Skin is warm and dry.  Neurological:     Mental Status: She is alert and oriented to person, place, and time.      UC Treatments / Results  Labs (all labs ordered are listed, but only abnormal results are displayed) Labs Reviewed - No data to display  EKG  Radiology No results found.  Procedures Procedures   Medications Ordered in UC Medications - No data to display  Initial Impression / Assessment and Plan / UC Course  I have reviewed the triage vital signs and the nursing notes.  Pertinent labs & imaging results that were available during my care of the patient were reviewed by me and considered in my medical decision making (see chart for details).  Afebrile, well appearing Unsure etiology. Consider salivary  gland Recommend to continue sour candy. Add warm compress, ibu/tylenol for pain, warm compress Will cover for bacterial infection with augmentin BID x 5 days Return precautions discussed. Patient agrees to plan  Final Clinical Impressions(s) / UC Diagnoses   Final diagnoses:  Left facial swelling     Discharge Instructions      Please take medication as prescribed. Take with food to avoid upset stomach.  Continue sour candies. Try warm compress, tylenol/ibuprofen, and lots of fluids  Please monitor symptoms and return if no improvement. Please go to the emergency department if symptoms worsen.      ED Prescriptions     Medication Sig Dispense Auth. Provider   amoxicillin-clavulanate (AUGMENTIN) 875-125 MG tablet Take 1 tablet by mouth 2 (two) times daily for 7 days. 14 tablet Misti Towle, Wells Guiles, PA-C      PDMP not reviewed this encounter.   [place], [place] 01/01/23 1133

## 2023-07-31 ENCOUNTER — Emergency Department (HOSPITAL_COMMUNITY)
Admission: EM | Admit: 2023-07-31 | Discharge: 2023-07-31 | Disposition: A | Discharge status: Home / Self Care | Payer: Medicare Other | Attending: Emergency Medicine | Admitting: Emergency Medicine

## 2023-07-31 ENCOUNTER — Encounter (HOSPITAL_COMMUNITY): Payer: Self-pay

## 2023-07-31 ENCOUNTER — Emergency Department (HOSPITAL_COMMUNITY): Payer: Medicare Other

## 2023-07-31 DIAGNOSIS — R110 Nausea: Secondary | ICD-10-CM | POA: Diagnosis not present

## 2023-07-31 DIAGNOSIS — I517 Cardiomegaly: Secondary | ICD-10-CM | POA: Diagnosis not present

## 2023-07-31 DIAGNOSIS — R3915 Urgency of urination: Secondary | ICD-10-CM | POA: Diagnosis not present

## 2023-07-31 DIAGNOSIS — R55 Syncope and collapse: Secondary | ICD-10-CM | POA: Diagnosis present

## 2023-07-31 DIAGNOSIS — Z79899 Other long term (current) drug therapy: Secondary | ICD-10-CM | POA: Diagnosis not present

## 2023-07-31 LAB — CBC WITH DIFFERENTIAL/PLATELET
Abs Immature Granulocytes: 0.03 10*3/uL (ref 0.00–0.07)
Basophils Absolute: 0.1 10*3/uL (ref 0.0–0.1)
Basophils Relative: 1 %
Eosinophils Absolute: 0.2 10*3/uL (ref 0.0–0.5)
Eosinophils Relative: 2 %
HCT: 40.1 % (ref 36.0–46.0)
Hemoglobin: 13.1 g/dL (ref 12.0–15.0)
Immature Granulocytes: 1 %
Lymphocytes Relative: 26 %
Lymphs Abs: 1.6 10*3/uL (ref 0.7–4.0)
MCH: 30.4 pg (ref 26.0–34.0)
MCHC: 32.7 g/dL (ref 30.0–36.0)
MCV: 93 fL (ref 80.0–100.0)
Monocytes Absolute: 0.4 10*3/uL (ref 0.1–1.0)
Monocytes Relative: 6 %
Neutro Abs: 4 10*3/uL (ref 1.7–7.7)
Neutrophils Relative %: 64 %
Platelets: 179 10*3/uL (ref 150–400)
RBC: 4.31 MIL/uL (ref 3.87–5.11)
RDW: 12.6 % (ref 11.5–15.5)
WBC: 6.2 10*3/uL (ref 4.0–10.5)
nRBC: 0 % (ref 0.0–0.2)

## 2023-07-31 LAB — COMPREHENSIVE METABOLIC PANEL
ALT: 17 U/L (ref 0–44)
AST: 18 U/L (ref 15–41)
Albumin: 4.2 g/dL (ref 3.5–5.0)
Alkaline Phosphatase: 54 U/L (ref 38–126)
Anion gap: 11 (ref 5–15)
BUN: 17 mg/dL (ref 8–23)
CO2: 25 mmol/L (ref 22–32)
Calcium: 9.2 mg/dL (ref 8.9–10.3)
Chloride: 103 mmol/L (ref 98–111)
Creatinine, Ser: 0.8 mg/dL (ref 0.44–1.00)
GFR, Estimated: >60 mL/min (ref >60)
Glucose, Bld: 118 mg/dL — ABNORMAL HIGH (ref 70–99)
Potassium: 3.7 mmol/L (ref 3.5–5.1)
Sodium: 139 mmol/L (ref 135–145)
Total Bilirubin: 0.6 mg/dL (ref 0.3–1.2)
Total Protein: 7.2 g/dL (ref 6.5–8.1)

## 2023-07-31 LAB — URINALYSIS, W/ REFLEX TO CULTURE (INFECTION SUSPECTED)
Bilirubin Urine: NEGATIVE
Glucose, UA: NEGATIVE mg/dL
Hgb urine dipstick: NEGATIVE
Ketones, ur: NEGATIVE mg/dL
Nitrite: NEGATIVE
Protein, ur: NEGATIVE mg/dL
Specific Gravity, Urine: 1.014 (ref 1.005–1.030)
pH: 6 (ref 5.0–8.0)

## 2023-07-31 LAB — TROPONIN I (HIGH SENSITIVITY)
Troponin I (High Sensitivity): 29 ng/L — ABNORMAL HIGH (ref <18)
Troponin I (High Sensitivity): 2 ng/L (ref <18)

## 2023-07-31 LAB — ETHANOL: Alcohol, Ethyl (B): <10 mg/dL (ref <10)

## 2023-07-31 MED ORDER — SODIUM CHLORIDE 0.9 % IV BOLUS: 500.0000 mL | Freq: Once | INTRAVENOUS | Status: DC

## 2023-07-31 MED ORDER — ONDANSETRON HCL 4 MG/2ML IJ SOLN
4.0000 mg | Freq: Once | INTRAMUSCULAR | Status: AC
  Administered 2023-07-31: 4 mg via INTRAVENOUS
  Filled 2023-07-31: qty 2

## 2023-07-31 NOTE — ED Provider Notes (Signed)
[facility] AT [facility] Provider Note   CSN: [id number] Arrival date & time: 07/31/23  7425     History  Chief Complaint  Patient presents with   Near Syncope    Veronica Santana is a 72 y.o. female.  72 year old female with prior medical history as detailed below presents for evaluation.  Patient reports brief syncopal event that occurred this morning.  Patient started to feel nauseated at home.  She put her head down.  She then had a brief syncope.  She did not fall or hurt herself during the syncopal event.  She reports that her husband was able to catch her prior to her going down.  On arrival to the ED she now feels much improved.  She denies associated chest pain, shortness of breath, abdominal pain.  She reports the nausea is passed.  She reports that she did throw up at home but has had no further emesis.  She is without bowel movement change.  She is without urinary symptoms such as dysuria, increased urinary frequency, etc.  The history is provided by the patient and medical records.       Home Medications Prior to Admission medications   Medication Sig Start Date End Date Taking? Authorizing Provider  amLODipine (NORVASC) 2.5 MG tablet     [provider]  DULoxetine (CYMBALTA) 30 MG capsule Take by mouth. 01/24/18   [provider]  lisinopril (ZESTRIL) 5 MG tablet Take 5 mg by mouth daily.    [provider]      Allergies    Patient has no known allergies.    Review of Systems   Review of Systems  Cardiovascular:  Positive for near-syncope.  All other systems reviewed and are negative.   Physical Exam Updated Vital Signs BP 124/60   Pulse 66   Temp 97.6 F (36.4 C) (Oral)   Resp 11   SpO2 99%  Physical Exam Vitals and nursing note reviewed.  Constitutional:      General: She is not in acute distress.    Appearance: Normal appearance. She is well-developed.  HENT:     Head:  Normocephalic and atraumatic.  Eyes:     Conjunctiva/sclera: Conjunctivae normal.     Pupils: Pupils are equal, round, and reactive to light.  Cardiovascular:     Rate and Rhythm: Normal rate and regular rhythm.     Heart sounds: Normal heart sounds.  Pulmonary:     Effort: Pulmonary effort is normal. No respiratory distress.     Breath sounds: Normal breath sounds.  Abdominal:     General: There is no distension.     Palpations: Abdomen is soft.     Tenderness: There is no abdominal tenderness.  Musculoskeletal:        General: No deformity. Normal range of motion.     Cervical back: Normal range of motion and neck supple.  Skin:    General: Skin is warm and dry.  Neurological:     General: No focal deficit present.     Mental Status: She is alert and oriented to person, place, and time.     ED Results / Procedures / Treatments   Labs (all labs ordered are listed, but only abnormal results are displayed) Labs Reviewed  CBC WITH DIFFERENTIAL/PLATELET  COMPREHENSIVE METABOLIC PANEL  ETHANOL  URINALYSIS, W/ REFLEX TO CULTURE (INFECTION SUSPECTED)  TROPONIN I (HIGH SENSITIVITY)    EKG EKG Interpretation Date/Time:  Sunday July 31 2023  08:23:33 EDT Ventricular Rate:  66 PR Interval:  143 QRS Duration:  104 QT Interval:  425 QTC Calculation: 446 R Axis:   -26  Text Interpretation: Sinus rhythm Borderline left axis deviation Confirmed by Kristine Royal [phone]) on 07/31/2023 8:24:59 AM  Radiology No results found.  Procedures Procedures    Medications Ordered in ED Medications  ondansetron (ZOFRAN) injection 4 mg (has no administration in time range)    ED Course/ Medical Decision Making/ A&P                                 Medical Decision Making Amount and/or Complexity of Data Reviewed Labs: ordered. Radiology: ordered.  Risk Prescription drug management.    Medical Screen Complete  This patient presented to the ED with complaint of nausea,  syncope.  This complaint involves an extensive number of treatment options. The initial differential diagnosis includes, but is not limited to, metabolic abnormality, infection, etc.  This presentation is: Acute, Self-Limited, Previously Undiagnosed, Uncertain Prognosis, Complicated, Systemic Symptoms, and Threat to Life/Bodily Function  Patient reports brief syncope brought on by nausea and emesis x 1.  Patient with remarkable improvement in symptoms on initial evaluation.  Screening labs obtained are without significant abnormality.  Patient feels much improved during evaluation.  At time of discharge she desires discharge.  She declines additional observation and/or admission.  She is taking p.o. well.  Both she and her husband understand need for close outpatient follow-up.  Strict return precautions given and understood. Additional history obtained:  External records from outside sources obtained and reviewed including prior ED visits and prior Inpatient records.    Lab Tests:  I ordered and personally interpreted labs.  The pertinent results include: CBC, CMP, UA, troponin   Imaging Studies ordered:  I ordered imaging studies including chest x-ray I independently visualized and interpreted obtained imaging which showed NAD I agree with the radiologist interpretation.   Cardiac Monitoring:  The patient was maintained on a cardiac monitor.  I personally viewed and interpreted the cardiac monitor which showed an underlying rhythm of: NSR   Medicines ordered:  I ordered medication including Zofran for nausea Reevaluation of the patient after these medicines showed that the patient: improved   Problem List / ED Course:  Syncope associated with nausea/emesis   Reevaluation:  After the interventions noted above, I reevaluated the patient and found that they have: improved   Disposition:  After consideration of the diagnostic results and the patients response to  treatment, I feel that the patent would benefit from close outpatient follow-up.          Final Clinical Impression(s) / ED Diagnoses Final diagnoses:  Near syncope  Nausea    Rx / DC Orders ED Discharge Orders     None         Wynetta Fines, MD 07/31/23 1600

## 2023-07-31 NOTE — ED Triage Notes (Signed)
[facility] reports pt coming from home for syncopal episode. Pt started to feel sick and diaphoretic, pt had syncopal episode and fell, did not hit her head.

## 2023-07-31 NOTE — Discharge Instructions (Addendum)
Return for any problem.  ?

## 2023-08-01 LAB — URINE CULTURE: Culture: <10000 — AB

## 2024-03-06 ENCOUNTER — Encounter (HOSPITAL_COMMUNITY): Payer: Self-pay

## 2024-03-06 ENCOUNTER — Ambulatory Visit (HOSPITAL_COMMUNITY)
Admission: RE | Admit: 2024-03-06 | Discharge: 2024-03-06 | Disposition: A | Discharge status: Home / Self Care | Payer: Self-pay | Source: Ambulatory Visit | Attending: Family Medicine | Admitting: Family Medicine

## 2024-03-06 ENCOUNTER — Other Ambulatory Visit: Payer: Self-pay

## 2024-03-06 VITALS — BP 128/73 | HR 100 | Temp 98.0°F | Resp 18

## 2024-03-06 DIAGNOSIS — J0100 Acute maxillary sinusitis, unspecified: Secondary | ICD-10-CM | POA: Diagnosis not present

## 2024-03-06 MED ORDER — AMOXICILLIN 875 MG PO TABS: 875.0000 mg | ORAL_TABLET | Freq: Two times a day (BID) | ORAL | 0 refills | Status: AC

## 2024-03-06 NOTE — ED Provider Notes (Signed)
  [facility]   [id number] 03/06/24 Arrival Time: 1416  ASSESSMENT & PLAN:  1. Acute non-recurrent maxillary sinusitis    Begin: Meds ordered this encounter  Medications   amoxicillin (AMOXIL) 875 MG tablet    Sig: Take 1 tablet (875 mg total) by mouth 2 (two) times daily for 7 days.    Dispense:  14 tablet    Refill:  0   Discussed typical duration of symptoms. OTC symptom care as needed. Ensure adequate fluid intake and rest.   Follow-up Information     Cetrone, Neysa Hotter, MD.   Specialty: Geriatric Medicine Why: As needed. Contact information: [address] [place] [address] [phone]                 Reviewed expectations re: course of current medical issues. Questions answered. Outlined signs and symptoms indicating need for more acute intervention. Patient verbalized understanding. After Visit Summary given.   SUBJECTIVE: History from: patient.  Veronica Santana is a 73 y.o. female who presents with complaint of nasal congestion, post-nasal drainage, and sinus pain. Onset gradual,  1.5 weeks . Respiratory symptoms: none. Fever: absent. Overall normal PO intake without n/v.  Social History   Tobacco Use  Smoking Status Never  Smokeless Tobacco Never    OBJECTIVE:  Vitals:   03/06/24 1508 03/06/24 1511  BP:  128/73  Pulse: 100   Resp: 18   Temp: 98 F (36.7 C)   TempSrc: Oral   SpO2: 97%      General appearance: alert; no distress HEENT: nasal congestion; clear runny nose; throat irritation secondary to post-nasal drainage; bilateral maxillary tenderness to palpation; turbinates boggy Neck: supple without LAD; trachea midline Lungs: unlabored respirations, symmetrical air entry; cough: absent; no respiratory distress Skin: warm and dry Psychological: alert and cooperative; normal mood and affect  No Known Allergies  History reviewed. No pertinent past medical history. Family History   Problem Relation Age of Onset   Hypertension Mother    Hypertension Father    Social History   Socioeconomic History   Marital status: Married    Spouse name: Not on file   Number of children: Not on file   Years of education: Not on file   Highest education level: Not on file  Occupational History   Not on file  Tobacco Use   Smoking status: Never   Smokeless tobacco: Never  Vaping Use   Vaping status: Never Used  Substance and Sexual Activity   Alcohol use: Yes    Comment: occasionally   Drug use: Never   Sexual activity: Not on file  Other Topics Concern   Not on file  Social History Narrative   Not on file   Social Drivers of Health   Financial Resource Strain: Not on file  Food Insecurity: Not on file  Transportation Needs: No Transportation Needs (01/04/2023)   Received from [facility]   PRAPARE - Transportation    Lack of Transportation (Medical): No    Lack of Transportation (Non-Medical): No  Physical Activity: Not on file  Stress: Not on file  Social Connections: Not on file  Intimate Partner Violence: Not on file             Mardella Layman, MD 03/06/24 1756

## 2024-03-06 NOTE — ED Notes (Signed)
 Attempt made to call appointment pt on the lobby with no respond.

## 2024-03-06 NOTE — ED Triage Notes (Signed)
 Nasal congestion and sinus pressure for the past 10 days.
